# Patient Record
Sex: Female | Born: 1965 | Race: White | Hispanic: No | Marital: Single | Smoking: Never smoker
Health system: Southern US, Community
[De-identification: ages and names within clinical notes are randomized; demographics above are authoritative.]

## PROBLEM LIST (undated history)

## (undated) DIAGNOSIS — I10 Essential (primary) hypertension: Secondary | ICD-10-CM

## (undated) HISTORY — DX: Essential (primary) hypertension: I10

---

## 2016-01-19 LAB — TSH: TSH: 5.23 (ref 0.41–5.90)

## 2016-09-20 ENCOUNTER — Ambulatory Visit: Payer: Self-pay | Admitting: Physician Assistant

## 2016-09-26 ENCOUNTER — Ambulatory Visit: Payer: Self-pay | Admitting: Physician Assistant

## 2016-09-29 ENCOUNTER — Ambulatory Visit: Payer: Self-pay | Admitting: Physician Assistant

## 2016-10-10 ENCOUNTER — Ambulatory Visit: Payer: Self-pay

## 2016-10-10 ENCOUNTER — Ambulatory Visit: Payer: Self-pay | Admitting: Physician Assistant

## 2016-10-25 ENCOUNTER — Ambulatory Visit (INDEPENDENT_AMBULATORY_CARE_PROVIDER_SITE_OTHER): Payer: BLUE CROSS/BLUE SHIELD | Admitting: Physician Assistant

## 2016-10-25 VITALS — BP 140/92 | HR 76 | Ht 66.0 in | Wt 365.0 lb

## 2016-10-25 DIAGNOSIS — E039 Hypothyroidism, unspecified: Secondary | ICD-10-CM | POA: Diagnosis not present

## 2016-10-25 DIAGNOSIS — I10 Essential (primary) hypertension: Secondary | ICD-10-CM

## 2016-10-25 DIAGNOSIS — F439 Reaction to severe stress, unspecified: Secondary | ICD-10-CM

## 2016-10-25 DIAGNOSIS — R635 Abnormal weight gain: Secondary | ICD-10-CM | POA: Diagnosis not present

## 2016-10-25 DIAGNOSIS — K640 First degree hemorrhoids: Secondary | ICD-10-CM | POA: Diagnosis not present

## 2016-10-25 MED ORDER — PHENTERMINE HCL 37.5 MG PO TABS: 37.5000 mg | ORAL_TABLET | Freq: Every day | ORAL | 0 refills | Status: DC

## 2016-10-25 NOTE — Patient Instructions (Addendum)
belviq saxenda  About Hemorrhoids  Hemorrhoids are swollen veins in the lower rectum and anus.  Also called piles, hemorrhoids are a common problem.  Hemorrhoids may be internal (inside the rectum) or external (around the anus).  Internal Hemorrhoids  Internal hemorrhoids are often painless, but they rarely cause bleeding.  The internal veins may stretch and fall down (prolapse) through the anus to the outside of the body.  The veins may then become irritated and painful.  External Hemorrhoids  External hemorrhoids can be easily seen or felt around the anal opening.  They are under the skin around the anus.  When the swollen veins are scratched or broken by straining, rubbing or wiping they sometimes bleed.  How Hemorrhoids Occur  Veins in the rectum and around the anus tend to swell under pressure.  Hemorrhoids can result from increased pressure in the veins of your anus or rectum.  Some sources of pressure are:   Straining to have a bowel movement because of constipation  Waiting too long to have a bowel movement  Coughing and sneezing often  Sitting for extended periods of time, including on the toilet  Diarrhea  Obesity  Trauma or injury to the anus  Some liver diseases  Stress  Family history of hemorrhoids  Pregnancy  Pregnant women should try to avoid becoming constipated, because they are more likely to have hemorrhoids during pregnancy.  In the last trimester of pregnancy, the enlarged uterus may press on blood vessels and causes hemorrhoids.  In addition, the strain of childbirth sometimes causes hemorrhoids after the birth.  Symptoms of Hemorrhoids  Some symptoms of hemorrhoids include:  Swelling and/or a tender lump around the anus  Itching, mild burning and bleeding around the anus  Painful bowel movements with or without constipation  Bright red blood covering the stool, on toilet paper or in the toilet bowel.   Symptoms usually go away within a  few days.  Always talk to your doctor about any bleeding to make sure it is not from some other causes.  Diagnosing and Treating Hemorrhoids  Diagnosis is made by an examination by your healthcare provider.  Special test can be performed by your doctor.    Most cases of hemorrhoids can be treated with:  High-fiber diet: Eat more high-fiber foods, which help prevent constipation.  Ask for more detailed fiber information on types and sources of fiber from your healthcare provider.  Fluids: Drink plenty of water.  This helps soften bowel movements so they are easier to pass.  Sitz baths and cold packs: Sitting in lukewarm water two or three times a day for 15 minutes cleases the anal area and may relieve discomfort.  If the water is too hot, swelling around the anus will get worse.  Placing a cloth-covered ice pack on the anus for ten minutes four times a day can also help reduce selling.  Gently pushing a prolapsed hemorrhoid back inside after the bath or ice pack can be helpful.  Medications: For mild discomfort, your healthcare provider may suggest over-the-counter pain medication or prescribe a cream or ointment for topical use.  The cream may contain witch hazel, zinc oxide or petroleum jelly.  Medicated suppositories are also a treatment option.  Always consult your doctor before applying medications or creams.  Procedures and surgeries: There are also a number of procedures and surgeries to shrink or remove hemorrhoids in more serious cases.  Talk to your physician about these options.  You can often prevent  hemorrhoids or keep them from becoming worse by maintaining a healthy lifestyle.  Eat a fiber-rich diet of fruits, vegetables and whole grains.  Also, drink plenty of water and exercise regularly.   2007, [employer] Doc.30

## 2016-10-25 NOTE — Progress Notes (Signed)
Subjective:    Patient ID: Ann Stuart, female    DOB: 02/23/1965, 51 y.o.   MRN: [id number]  HPI  Pt is a 51 yo morbidly obese female who presents to the clinic to establish care.   .. Active Ambulatory Problems    Diagnosis Date Noted  . Hypothyroidism 10/25/2016  . Situational stress 10/25/2016  . Morbid obesity (HCC) 10/25/2016  . Abnormal weight gain 10/25/2016  . Essential hypertension 10/25/2016  . Grade I hemorrhoids 10/30/2016   Resolved Ambulatory Problems    Diagnosis Date Noted  . No Resolved Ambulatory Problems   No Additional Past Medical History   .Marland Kitchen Social History   Social History  . Marital status: Single    Spouse name: N/A  . Number of children: N/A  . Years of education: N/A   Occupational History  . Not on file.   Social History Main Topics  . Smoking status: Never Smoker  . Smokeless tobacco: Never Used  . Alcohol use Yes  . Drug use: No  . Sexual activity: Yes   Other Topics Concern  . Not on file   Social History Narrative  . No narrative on file   Pt moved here from [place] and gained 80lbs since move. She moved here for her boyfriend. 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 She eats a lot of fast food. Pt would like to get healthy again and start with weight loss.   She is in a stressful relationship with a man who is also married. This puts extra stress on her.   She is also having some painful bowel movements with some pressure. She has not tried anything to make better. Occasionally had some bright red blood.    Review of Systems  All other systems reviewed and are negative.      Objective:   Physical Exam  Constitutional: She is oriented to person, place, and time. She appears well-developed and well-nourished.  HENT:  Head: Normocephalic and atraumatic.  Neck: Normal range of motion. Neck supple. No thyromegaly present.  Cardiovascular: Normal rate, regular rhythm and normal heart sounds.   Pulmonary/Chest:  Effort normal and breath sounds normal. She has no wheezes.  Genitourinary:     Neurological: She is alert and oriented to person, place, and time.  Skin: Skin is dry.  Psychiatric: She has a normal mood and affect. Her behavior is normal.          Assessment & Plan:  Marland KitchenMarland KitchenFlorence was seen today for establish care.  Diagnoses and all orders for this visit:  Essential hypertension  Abnormal weight gain -     phentermine (ADIPEX-P) 37.5 MG tablet; Take 1 tablet (37.5 mg total) by mouth daily before breakfast.  Morbid obesity (HCC) -     phentermine (ADIPEX-P) 37.5 MG tablet; Take 1 tablet (37.5 mg total) by mouth daily before breakfast.  Situational stress  Hypothyroidism, unspecified type  Grade I hemorrhoids -     hydrocortisone-pramoxine (PROCTOFOAM-HC) rectal foam; Place 1 applicator rectally 2 (two) times daily.   .. Depression screen PHQ 2/9 10/30/2016  Decreased Interest 1  Down, Depressed, Hopeless 2  PHQ - 2 Score 3  Altered sleeping 1  Tired, decreased energy 3  Change in appetite 3  Feeling bad or failure about yourself  2  Trouble concentrating 0  Moving slowly or fidgety/restless 0  Suicidal thoughts 0  PHQ-9 Score 12  Difficult doing work/chores Somewhat difficult   Pt does not  want to start any medications at this time.   Start cream for hemmorrhoids. HO given for prevention. Weight loss could help. Also daily miralax.   Discussed options for weight loss. Pt would like to start phentermine. Discussed side effects. Marland Kitchen.Discussed low carb diet with 1500 calories and 80g of protein.  Exercising at least 150 minutes a week.  [employer] could be a Chief Technology Officer.  Encouraged long term medications.  Will have to watch BP closely.  Follow up in 1 month.  Pt aware could make constipation worse.

## 2016-10-30 ENCOUNTER — Encounter: Payer: Self-pay | Admitting: Physician Assistant

## 2016-10-30 DIAGNOSIS — K640 First degree hemorrhoids: Secondary | ICD-10-CM | POA: Insufficient documentation

## 2016-10-30 MED ORDER — HYDROCORTISONE ACE-PRAMOXINE 1-1 % RE FOAM: 1.0000 | Freq: Two times a day (BID) | RECTAL | 5 refills | Status: DC

## 2016-11-27 ENCOUNTER — Ambulatory Visit: Payer: BLUE CROSS/BLUE SHIELD | Admitting: Physician Assistant

## 2016-11-27 DIAGNOSIS — Z0189 Encounter for other specified special examinations: Secondary | ICD-10-CM

## 2017-02-20 ENCOUNTER — Telehealth: Payer: Self-pay | Admitting: Physician Assistant

## 2017-02-20 MED ORDER — METOPROLOL SUCCINATE ER 200 MG PO TB24: 200.0000 mg | ORAL_TABLET | Freq: Every day | ORAL | 0 refills | Status: DC

## 2017-02-20 MED ORDER — LOSARTAN POTASSIUM 100 MG PO TABS: 100.0000 mg | ORAL_TABLET | Freq: Every day | ORAL | 0 refills | Status: DC

## 2017-02-20 MED ORDER — OMEPRAZOLE 20 MG PO CPDR: 20.0000 mg | DELAYED_RELEASE_CAPSULE | Freq: Every day | ORAL | 3 refills | Status: DC

## 2017-02-20 MED ORDER — AMLODIPINE BESYLATE 10 MG PO TABS: 10.0000 mg | ORAL_TABLET | Freq: Every day | ORAL | 0 refills | Status: DC

## 2017-02-20 MED ORDER — LEVOTHYROXINE SODIUM 137 MCG PO TABS: 137.0000 ug | ORAL_TABLET | Freq: Every day | ORAL | 0 refills | Status: DC

## 2017-02-20 NOTE — Telephone Encounter (Signed)
I sent refills for one month until visit.

## 2017-02-20 NOTE — Telephone Encounter (Signed)
All Rx's requested are listed by historical provider, will route for review.

## 2017-02-20 NOTE — Telephone Encounter (Signed)
Pt called and stated she needs a refill on her Prilosec, toprol, levothyroxine, amlodipine, losartan. She needs those sent to the [facility] in [place] off [address]. She has an appointment with Lesly RubensteinJade on Jan. 22,2019. Thanks

## 2017-02-21 NOTE — Telephone Encounter (Signed)
Pt advised. No further questions.  

## 2017-02-27 ENCOUNTER — Ambulatory Visit: Payer: BLUE CROSS/BLUE SHIELD | Admitting: Physician Assistant

## 2017-03-06 ENCOUNTER — Ambulatory Visit: Payer: Self-pay | Admitting: Physician Assistant

## 2017-03-13 ENCOUNTER — Ambulatory Visit: Payer: Self-pay | Admitting: Physician Assistant

## 2017-03-16 ENCOUNTER — Ambulatory Visit: Payer: Self-pay

## 2017-03-20 ENCOUNTER — Encounter: Payer: Self-pay | Admitting: Physician Assistant

## 2017-03-20 ENCOUNTER — Ambulatory Visit (INDEPENDENT_AMBULATORY_CARE_PROVIDER_SITE_OTHER): Payer: BLUE CROSS/BLUE SHIELD | Admitting: Physician Assistant

## 2017-03-20 ENCOUNTER — Ambulatory Visit (INDEPENDENT_AMBULATORY_CARE_PROVIDER_SITE_OTHER): Payer: BLUE CROSS/BLUE SHIELD

## 2017-03-20 ENCOUNTER — Encounter (INDEPENDENT_AMBULATORY_CARE_PROVIDER_SITE_OTHER): Payer: Self-pay

## 2017-03-20 DIAGNOSIS — Z1231 Encounter for screening mammogram for malignant neoplasm of breast: Secondary | ICD-10-CM

## 2017-03-20 DIAGNOSIS — Z8701 Personal history of pneumonia (recurrent): Secondary | ICD-10-CM | POA: Diagnosis not present

## 2017-03-20 DIAGNOSIS — H8109 Meniere's disease, unspecified ear: Secondary | ICD-10-CM

## 2017-03-20 DIAGNOSIS — I10 Essential (primary) hypertension: Secondary | ICD-10-CM

## 2017-03-20 DIAGNOSIS — Z131 Encounter for screening for diabetes mellitus: Secondary | ICD-10-CM

## 2017-03-20 DIAGNOSIS — R0602 Shortness of breath: Secondary | ICD-10-CM | POA: Diagnosis not present

## 2017-03-20 DIAGNOSIS — IMO0002 Reserved for concepts with insufficient information to code with codable children: Secondary | ICD-10-CM | POA: Insufficient documentation

## 2017-03-20 DIAGNOSIS — Z13220 Encounter for screening for lipoid disorders: Secondary | ICD-10-CM | POA: Diagnosis not present

## 2017-03-20 DIAGNOSIS — E039 Hypothyroidism, unspecified: Secondary | ICD-10-CM | POA: Diagnosis not present

## 2017-03-20 DIAGNOSIS — R0789 Other chest pain: Secondary | ICD-10-CM

## 2017-03-20 DIAGNOSIS — L719 Rosacea, unspecified: Secondary | ICD-10-CM

## 2017-03-20 DIAGNOSIS — Z1211 Encounter for screening for malignant neoplasm of colon: Secondary | ICD-10-CM

## 2017-03-20 DIAGNOSIS — Q349 Congenital malformation of respiratory system, unspecified: Secondary | ICD-10-CM

## 2017-03-20 DIAGNOSIS — N926 Irregular menstruation, unspecified: Secondary | ICD-10-CM

## 2017-03-20 MED ORDER — DOXYCYCLINE 40 MG PO CPDR: 40.0000 mg | DELAYED_RELEASE_CAPSULE | ORAL | 2 refills | Status: DC

## 2017-03-20 NOTE — Patient Instructions (Addendum)
[employer]- life coach [employer] 2 week recheck BP in office or send via mychart.   Exercise HR is 220-age.   Liraglutide injection (Weight Management) What is this medicine? LIRAGLUTIDE (LIR a GLOO tide) is used with a reduced calorie diet and exercise to help you lose weight. This medicine may be used for other purposes; ask your health care provider or pharmacist if you have questions. COMMON BRAND NAME(S): Saxenda What should I tell my health care provider before I take this medicine? They need to know if you have any of these conditions: -endocrine tumors (MEN 2) or if someone in your family had these tumors -gallbladder disease -high cholesterol -history of alcohol abuse problem -history of pancreatitis -kidney disease or if you are on dialysis -liver disease -previous swelling of the tongue, face, or lips with difficulty breathing, difficulty swallowing, hoarseness, or tightening of the throat -stomach problems -suicidal thoughts, plans, or attempt; a previous suicide attempt by you or a family member -thyroid cancer or if someone in your family had thyroid cancer -an unusual or allergic reaction to liraglutide, other medicines, foods, dyes, or preservatives -pregnant or trying to get pregnant -breast-feeding How should I use this medicine? This medicine is for injection under the skin of your upper leg, stomach area, or upper arm. You will be taught how to prepare and give this medicine. Use exactly as directed. Take your medicine at regular intervals. Do not take it more often than directed. It is important that you put your used needles and syringes in a special sharps container. Do not put them in a trash can. If you do not have a sharps container, call your pharmacist or healthcare provider to get one. A special MedGuide will be given to you by the pharmacist with each prescription and refill. Be sure to read this information carefully each time. Talk to your  pediatrician regarding the use of this medicine in children. Special care may be needed. Overdosage: If you think you have taken too much of this medicine contact a poison control center or emergency room at once. NOTE: This medicine is only for you. Do not share this medicine with others. What if I miss a dose? If you miss a dose, take it as soon as you can. If it is almost time for your next dose, take only that dose. Do not take double or extra doses. If you miss your dose for 3 days or more, call your doctor or health care professional to talk about how to restart this medicine. What may interact with this medicine? -insulin and other medicines for diabetes This list may not describe all possible interactions. Give your health care provider a list of all the medicines, herbs, non-prescription drugs, or dietary supplements you use. Also tell them if you smoke, drink alcohol, or use illegal drugs. Some items may interact with your medicine. What should I watch for while using this medicine? Visit your doctor or health care professional for regular checks on your progress. This medicine is intended to be used in addition to a healthy diet and appropriate exercise. The best results are achieved this way. Do not increase or in any way change your dose without consulting your doctor or health care professional. Drink plenty of fluids while taking this medicine. Check with your doctor or health care professional if you get an attack of severe diarrhea, nausea, and vomiting. The loss of too much body fluid can make it dangerous for you to take this medicine.  This medicine may affect blood sugar levels. If you have diabetes, check with your doctor or health care professional before you change your diet or the dose of your diabetic medicine. Patients and their families should watch out for worsening depression or thoughts of suicide. Also watch out for sudden changes in feelings such as feeling anxious,  agitated, panicky, irritable, hostile, aggressive, impulsive, severely restless, overly excited and hyperactive, or not being able to sleep. If this happens, especially at the beginning of treatment or after a change in dose, call your health care professional. What side effects may I notice from receiving this medicine? Side effects that you should report to your doctor or health care professional as soon as possible: -allergic reactions like skin rash, itching or hives, swelling of the face, lips, or tongue -breathing problems -diarrhea that continues or is severe -lump or swelling on the neck -severe nausea -signs and symptoms of infection like fever or chills; cough; sore throat; pain or trouble passing urine -signs and symptoms of low blood sugar such as feeling anxious, confusion, dizziness, increased hunger, unusually weak or tired, sweating, shakiness, cold, irritable, headache, blurred vision, fast heartbeat, loss of consciousness -signs and symptoms of kidney injury like trouble passing urine or change in the amount of urine -trouble swallowing -unusual stomach upset or pain -vomiting Side effects that usually do not require medical attention (report to your doctor or health care professional if they continue or are bothersome): -constipation -decreased appetite -diarrhea -fatigue -headache -nausea -pain, redness, or irritation at site where injected -stomach upset -stuffy or runny nose This list may not describe all possible side effects. Call your doctor for medical advice about side effects. You may report side effects to FDA at 1-800-FDA-1088. Where should I keep my medicine? Keep out of the reach of children. Store unopened pen in a refrigerator between 2 and 8 degrees C (36 and 46 degrees F). Do not freeze or use if the medicine has been frozen. Protect from light and excessive heat. After you first use the pen, it can be stored at room temperature between 15 and 30 degrees  C (59 and 86 degrees F) or in a refrigerator. Throw away your used pen after 30 days or after the expiration date, whichever comes first. Do not store your pen with the needle attached. If the needle is left on, medicine may leak from the pen. NOTE: This sheet is a summary. It may not cover all possible information. If you have questions about this medicine, talk to your doctor, pharmacist, or health care provider.  2018 Elsevier/Gold Standard (2016-02-10 14:41:37) Lorcaserin oral tablets What is this medicine? LORCASERIN (lor ca SER in) is used to promote and maintain weight loss in obese patients. This medicine should be used with a reduced calorie diet and, if appropriate, an exercise program. This medicine may be used for other purposes; ask your health care provider or pharmacist if you have questions. COMMON BRAND NAME(S): Belviq What should I tell my health care provider before I take this medicine? They need to know if you have any of these conditions: -anatomical deformation of the penis, Peyronie's disease, or history of priapism (painful and prolonged erection) -diabetes -heart disease -history of blood diseases, like sickle cell anemia or leukemia -history of irregular heartbeat -kidney disease -liver disease -suicidal thoughts, plans, or attempt; a previous suicide attempt by you or a family member -an unusual or allergic reaction to lorcaserin, other medicines, foods, dyes, or preservatives -pregnant or trying to  get pregnant -breast-feeding How should I use this medicine? Take this medicine by mouth with a glass of water. Follow the directions on the prescription label. You can take it with or without food. Take your medicine at regular intervals. Do not take it more often than directed. Do not stop taking except on your doctor's advice. Talk to your pediatrician regarding the use of this medicine in children. Special care may be needed. Overdosage: If you think you have  taken too much of this medicine contact a poison control center or emergency room at once. NOTE: This medicine is only for you. Do not share this medicine with others. What if I miss a dose? If you miss a dose, take it as soon as you can. If it is almost time for your next dose, take only that dose. Do not take double or extra doses. What may interact with this medicine? -cabergoline -certain medicines for depression, anxiety, or psychotic disturbances -certain medicines for erectile dysfunction -certain medicines for migraine headache like almotriptan, eletriptan, frovatriptan, naratriptan, rizatriptan, sumatriptan, zolmitriptan -dextromethorphan -linezolid -lithium -medicines for diabetes -other weight loss products -tramadol -St. John's Wort -stimulant medicines for attention disorders, weight loss, or to stay awake -tryptophan This list may not describe all possible interactions. Give your health care provider a list of all the medicines, herbs, non-prescription drugs, or dietary supplements you use. Also tell them if you smoke, drink alcohol, or use illegal drugs. Some items may interact with your medicine. What should I watch for while using this medicine? This medicine is intended to be used in addition to a healthy diet and appropriate exercise. The best results are achieved this way. Your doctor should instruct you to stop taking this medicine if you do not lose a certain amount of weight within the first 12 weeks of treatment, but it is important that you do not change your dose in any way without consulting your doctor or health care professional. Visit your doctor or health care professional for regular checkups. Your doctor may order blood tests or other tests to see how you are doing. Do not drive, use machinery, or do anything that needs mental alertness until you know how this medicine affects you. This medicine may affect blood sugar levels. If you have diabetes, check with  your doctor or health care professional before you change your diet or the dose of your diabetic medicine. Patients and their families should watch out for worsening depression or thoughts of suicide. Also watch out for sudden changes in feelings such as feeling anxious, agitated, panicky, irritable, hostile, aggressive, impulsive, severely restless, overly excited and hyperactive, or not being able to sleep. If this happens, especially at the beginning of treatment or after a change in dose, call your health care professional. Contact your doctor or health care professional right away if you are a man with an erection that lasts longer than 4 hours or if the erection becomes painful. This may be a sign of serious problem and must be treated right away to prevent permanent damage. What side effects may I notice from receiving this medicine? Side effects that you should report to your doctor or health care professional as soon as possible: -allergic reactions like skin rash, itching or hives, swelling of the face, lips, or tongue -abnormal production of milk -breast enlargement in both males and females -breathing problems -changes in emotions or moods -changes in vision -confusion -erection lasting more than 4 hours or a painful erection -fast or irregular  heart beat -feeling faint or lightheaded, falls -fever or chills, sore throat -hallucination, loss of contact with reality -high or low blood pressure -menstrual changes -restlessness -slow or irregular heartbeat -stiff muscles -sweating -suicidal thoughts or other mood changes -swelling of the ankles, feet, hands -unusually weak or tired -vomiting Side effects that usually do not require medical attention (report to your doctor or health care professional if they continue or are bothersome): -back pain -constipation -cough -dry mouth -nausea -tiredness This list may not describe all possible side effects. Call your doctor for  medical advice about side effects. You may report side effects to FDA at 1-800-FDA-1088. Where should I keep my medicine? Keep out of the reach of children. This medicine can be abused. Keep your medicine in a safe place to protect it from theft. Do not share this medicine with anyone. Selling or giving away this medicine is dangerous and against the law. Store at room temperature between 15 and 30 degrees C (59 and 86 degrees F). Throw away any unused medicine after the expiration date. NOTE: This sheet is a summary. It may not cover all possible information. If you have questions about this medicine, talk to your doctor, pharmacist, or health care provider.  2018 Elsevier/Gold Standard (2015-02-25 12:13:31)

## 2017-03-20 NOTE — Progress Notes (Signed)
Subjective:    Patient ID: Ann Stuart, female    DOB: [birth year], 52 y.o.   MRN: [id number]  HPI  Patient is a 52 year old morbidly obese female with a past medical history of hypertension, rosacea, hypothyroidism, who presents to the clinic today for follow-up.  Patient was seen once before for an introduction.  We have not really made any medication changes with patient.  She has just moved to [place] where she can be more accountable and make appointments.  At last visit we had talked about weight loss which is her biggest concern today.  She was given phentermine which she did not feel comfortable taking.  She is very concerned with her weight.  She feels like she just has to be motivated and know what to eat.  She feels like everything  contradicts himself.  She admits right now she is not exercising or dieting.  She is very interested in medication to help with this.  She also wants to make sure that her body is capable and ready to exercise.  She admits she is having some left-sided chest/breast pain palpable.  She feels like it could be worsening somewhat with exertion.  She is having shortness of breath with any exertion.  She wants to make sure this is okay.  She requests something for her rosacea.  She has tried MetroGel in the past with no benefit.  She has been on minocycline and worked well.  Patient is ready to get her health screenings done.  She needs referrals for mammograms and colonoscopies.  GYN in [place]. Need to get records.   .. Active Ambulatory Problems    Diagnosis Date Noted  . Hypothyroidism 10/25/2016  . Situational stress 10/25/2016  . Morbid obesity (HCC) 10/25/2016  . Abnormal weight gain 10/25/2016  . Essential hypertension 10/25/2016  . Grade I hemorrhoids 10/30/2016  . Meniere's disease 03/20/2017  . Thoracic cyst 03/20/2017  . Menstrual abnormality 03/20/2017  . Rosacea 03/20/2017  . SOB (shortness of breath) 03/20/2017  . Atypical chest  pain 03/20/2017   Resolved Ambulatory Problems    Diagnosis Date Noted  . No Resolved Ambulatory Problems   No Additional Past Medical History      Review of Systems    see HPI.  Objective:   Physical Exam  Constitutional: She is oriented to person, place, and time. She appears well-developed and well-nourished.  morbidy obesity.   HENT:  Head: Normocephalic and atraumatic.  Right Ear: External ear normal.  Left Ear: External ear normal.  Mouth/Throat: Oropharynx is clear and moist. No oropharyngeal exudate.  Cheeks erythematous.   Eyes: Conjunctivae are normal. Right eye exhibits no discharge. Left eye exhibits no discharge.  Neck: Normal range of motion. Neck supple. No thyromegaly present.  Neck fullness.   Cardiovascular: Normal rate, regular rhythm and normal heart sounds.  Pulmonary/Chest: Effort normal and breath sounds normal. She has no wheezes. She exhibits no tenderness.  Abdominal: Soft. Bowel sounds are normal.  Lymphadenopathy:    She has no cervical adenopathy.  Neurological: She is alert and oriented to person, place, and time.  Psychiatric: She has a normal mood and affect. Her behavior is normal.          Assessment & Plan:  Marland KitchenMarland KitchenDiagnoses and all orders for this visit:  Morbid obesity (HCC) -     CBC with Differential/Platelet  Hypothyroidism, unspecified type -     TSH  Meniere's disease, unspecified laterality -     CBC with Differential/Platelet  Screening for lipid disorders -     Lipid Panel w/reflex Direct LDL  Screening for diabetes mellitus -     COMPLETE METABOLIC PANEL WITH GFR -     CBC with Differential/Platelet  Thoracic cyst  Menstrual abnormality -     FSH/LH  Atypical chest pain -     EKG 12-Lead  SOB (shortness of breath) -     DG Chest 2 View  Rosacea  Essential hypertension  Visit for screening mammogram -     MM SCREENING BREAST TOMO BILATERAL; Future -     MM SCREENING BREAST TOMO BILATERAL  Colon  cancer screening -     Ambulatory referral to Gastroenterology  Other orders -     Discontinue: doxycycline (ORACEA) 40 MG capsule; Take 1 capsule (40 mg total) by mouth every morning.    .. [facility]2017 10/30/2016  Decreased Interest 0 1  Down, Depressed, Hopeless 0 2  PHQ - 2 Score 0 3  Altered sleeping 0 1  Tired, decreased energy 0 3  Change in appetite 0 3  Feeling bad or failure about yourself  0 2  Trouble concentrating 0 0  Moving slowly or fidgety/restless 0 0  Suicidal thoughts 0 0  PHQ-9 Score 0 12  Difficult doing work/chores - Somewhat difficult   Mammogram and colonoscopy ordered.  Fasting labs ordered.   Most of discussion today with surveilling patient's morbid obesity.  She wants to take control of this and start losing weight this year however she is really concerned with doing it the right way to make sure it does not affect her health.  She is having some atypical left-sided chest pain/breast pain.  She does not notice that better or worse with exertion.  The shortness of breath is always worse with exercise.  EKG done today normal sinus rhythm with no ST depression or elevation.  No arrhythmia.  Chest x-ray done today was unremarkable.  Discussed with patient I do think she can start exercising.  I gave her a target max heart rate of 220- her age.  I do think she should start with more than 5 pack exercises and possibly in a pool if she could.  I did discuss counting her calories to at least 1800 a day.  Encouraged her to decrease carbs and increase protein.  Encouraged at least 150 minutes of exercise a week.  I do think it would be also for her to have a life coach.  I do think to make these changes have been I gave her handouts on these 2 medications.  I gave her the name and number of a life coach and currently well, [place] MasterJessica Jones.  We discussed medication options.  She does not feel comfortable with phentermine.  Unfortunately that is the  cheapest option we have.  I did discuss 2 other long-term options with patient's Belviq and Saxenda.  I gave her handouts on these medications.  She can call back at any time and I will prescribe whichever when she would rather try.  She has no history of thyroid cancer or pancreatitis.  I spent a little time today reassuring patient that baby steps are okay towards making huge life changes.  I would like to meet with her in the next month to see how she is apical her weight loss journey.  My goal today was to make sure she is generally stable for exercise.  BP not at goal today. She is on norvasc/metoprolol/losaartan.  She reports good readings at home. Recheck in 2 weeks. Keep BP log. If not improving need to make some medication changes.   Doxycycline low dose given for rosacea.  Patient has tried MetroGel in the past and did not have a great experience with this.  Marland Kitchen.Spent 45 minutes with patient and greater than 50 percent of visit spent counseling patient regarding treatment plan.

## 2017-03-21 ENCOUNTER — Telehealth: Payer: Self-pay | Admitting: Physician Assistant

## 2017-03-21 NOTE — Telephone Encounter (Signed)
Forms sent to covermymeds. Awaiting determination.

## 2017-03-22 ENCOUNTER — Other Ambulatory Visit: Payer: Self-pay | Admitting: Physician Assistant

## 2017-03-23 MED ORDER — DOXYCYCLINE HYCLATE 20 MG PO TABS: 20.0000 mg | ORAL_TABLET | Freq: Two times a day (BID) | ORAL | 2 refills | Status: DC

## 2017-03-23 NOTE — Telephone Encounter (Signed)
Received letter form [employer] that Doxycycline 40 mg  capsules has been denied and patient will need to try and fail an alternative. Papers are in your box for review.

## 2017-03-23 NOTE — Telephone Encounter (Signed)
Per [employer] letter - capsule was not covered but tablet was. Called [facility], they ran tablet through for a copay of $38.41. Cancelled capsule and sent Rx for same strength in the available tablet form.

## 2017-03-25 NOTE — Telephone Encounter (Signed)
Thanks

## 2017-03-26 ENCOUNTER — Encounter: Payer: Self-pay | Admitting: Physician Assistant

## 2017-03-26 LAB — T4, FREE
T4,Free (Direct): 0.98
TSH: 7.42

## 2017-03-28 ENCOUNTER — Telehealth: Payer: Self-pay | Admitting: Physician Assistant

## 2017-03-28 NOTE — Telephone Encounter (Signed)
Pt called because she had a call from GI trying to schedule her for a colonoscopy but pt stated she got one back in 2017 and will send the records over for us to have. She did state that she is due for a mammogram and wants to know if one of those can be ordered. Thanks

## 2017-03-29 NOTE — Telephone Encounter (Signed)
Left VM for Pt to see where she had her colonoscopy so we can send a request for it. Also need to know if she has ever had an abnormal mammogram before so I know which screening mammogram to order. Callback information provided.

## 2017-04-11 ENCOUNTER — Ambulatory Visit: Payer: BLUE CROSS/BLUE SHIELD

## 2017-04-27 ENCOUNTER — Ambulatory Visit: Payer: BLUE CROSS/BLUE SHIELD

## 2017-05-05 ENCOUNTER — Other Ambulatory Visit: Payer: Self-pay | Admitting: Physician Assistant

## 2017-05-28 ENCOUNTER — Encounter: Payer: Self-pay | Admitting: Physician Assistant

## 2017-06-02 ENCOUNTER — Other Ambulatory Visit: Payer: Self-pay | Admitting: Physician Assistant

## 2017-07-13 ENCOUNTER — Other Ambulatory Visit: Payer: Self-pay | Admitting: Physician Assistant

## 2017-08-02 ENCOUNTER — Other Ambulatory Visit: Payer: Self-pay | Admitting: Physician Assistant

## 2017-08-17 ENCOUNTER — Other Ambulatory Visit: Payer: Self-pay | Admitting: Physician Assistant

## 2017-08-18 ENCOUNTER — Other Ambulatory Visit: Payer: Self-pay | Admitting: Physician Assistant

## 2017-08-24 ENCOUNTER — Ambulatory Visit (INDEPENDENT_AMBULATORY_CARE_PROVIDER_SITE_OTHER): Payer: BLUE CROSS/BLUE SHIELD | Admitting: Physician Assistant

## 2017-08-24 DIAGNOSIS — Z8742 Personal history of other diseases of the female genital tract: Secondary | ICD-10-CM

## 2017-08-24 DIAGNOSIS — Z1239 Encounter for other screening for malignant neoplasm of breast: Secondary | ICD-10-CM

## 2017-08-24 DIAGNOSIS — Z1211 Encounter for screening for malignant neoplasm of colon: Secondary | ICD-10-CM

## 2017-08-24 DIAGNOSIS — J302 Other seasonal allergic rhinitis: Secondary | ICD-10-CM | POA: Diagnosis not present

## 2017-08-24 DIAGNOSIS — G8929 Other chronic pain: Secondary | ICD-10-CM

## 2017-08-24 DIAGNOSIS — Q349 Congenital malformation of respiratory system, unspecified: Secondary | ICD-10-CM

## 2017-08-24 DIAGNOSIS — IMO0002 Reserved for concepts with insufficient information to code with codable children: Secondary | ICD-10-CM

## 2017-08-24 DIAGNOSIS — Z7251 High risk heterosexual behavior: Secondary | ICD-10-CM

## 2017-08-24 DIAGNOSIS — Z1231 Encounter for screening mammogram for malignant neoplasm of breast: Secondary | ICD-10-CM | POA: Diagnosis not present

## 2017-08-24 DIAGNOSIS — Z114 Encounter for screening for human immunodeficiency virus [HIV]: Secondary | ICD-10-CM | POA: Diagnosis not present

## 2017-08-24 DIAGNOSIS — R1032 Left lower quadrant pain: Secondary | ICD-10-CM | POA: Diagnosis not present

## 2017-08-24 DIAGNOSIS — M899 Disorder of bone, unspecified: Secondary | ICD-10-CM | POA: Diagnosis not present

## 2017-08-24 DIAGNOSIS — R1031 Right lower quadrant pain: Secondary | ICD-10-CM

## 2017-08-24 NOTE — Patient Instructions (Addendum)
contrave belviq saxenda qsymia  Start zyrtec/claritin/allegra- nasocort nasal spray.   Will get MRI and pelvic u/s.

## 2017-08-24 NOTE — Progress Notes (Signed)
Subjective:    Patient ID: Ann Stuart, female    DOB: February 11, 1965, 52 y.o.   MRN: [id number]  HPI  Pt is a morbidly obese [place] female who presents to the clinic to "start working on her health". She is ready to do screenings and work on weight.   She has not follewed up on thoracic cyst on past MRI in years. She request MRI.   She discuss hx of ovarian cyst and she is having intermittent lower quadrant pain and cramping and would like this looked at. She was also told her endometrium lining was thick.  She is concerned about cancer.   She never started phentermine for weight loss. She is not exercising or dieting.   She did not take BP medications today. She denies any CP, palpitations, headaches or vision changes.   She does have ongoing sinus pressure and nasal congestion. No runny nose. No fever, chills. She uses flonase with some relief. No other medications tried.   .. Active Ambulatory Problems    Diagnosis Date Noted  . Hypothyroidism 10/25/2016  . Situational stress 10/25/2016  . Morbidly obese (HCC) 10/25/2016  . Abnormal weight gain 10/25/2016  . Essential hypertension 10/25/2016  . Grade I hemorrhoids 10/30/2016  . Meniere's disease 03/20/2017  . Thoracic cyst 03/20/2017  . Menstrual abnormality 03/20/2017  . Rosacea 03/20/2017  . SOB (shortness of breath) 03/20/2017  . Atypical chest pain 03/20/2017  . Disorder of bone 08/27/2017  . History of ovarian cyst 08/27/2017  . Abdominal pain, chronic, bilateral lower quadrant 08/27/2017   Resolved Ambulatory Problems    Diagnosis Date Noted  . No Resolved Ambulatory Problems   No Additional Past Medical History     .   Review of Systems    see HPI.  Objective:   Physical Exam  Constitutional: She is oriented to person, place, and time. She appears well-developed and well-nourished.  HENT:  Head: Normocephalic and atraumatic.  Cardiovascular: Normal rate and regular rhythm.  Pulmonary/Chest:  Effort normal and breath sounds normal.  Neurological: She is alert and oriented to person, place, and time.  Psychiatric: She has a normal mood and affect. Her behavior is normal.          Assessment & Plan:  Marland Kitchen.Marland Kitchen.Diagnoses and all orders for this visit:  Morbidly obese (HCC)  Screening for HIV (human immunodeficiency virus) -     HIV antibody (with reflex)  History of ovarian cyst -     US PELVIS (TRANSABDOMINAL ONLY) -     US PELVIS TRANSVANGINAL NON-OB (TV ONLY)  Abdominal pain, chronic, bilateral lower quadrant -     US PELVIS (TRANSABDOMINAL ONLY) -     US PELVIS TRANSVANGINAL NON-OB (TV ONLY)  Unprotected sex  Disorder of bone -     MR Thoracic Spine Wo Contrast; Future -     MR Thoracic Spine Wo Contrast  Breast cancer screening -     MM 3D SCREEN BREAST BILATERAL  Colon cancer screening -     Ambulatory referral to Gastroenterology  Thoracic cyst  Seasonal allergic rhinitis, unspecified trigger   Will get MRI. See past MRI for comparison.   Ordered mammogram and colonoscopy for screening purposes.   Due to hx and intermittent pain. Will get u/s of pelvis.   Pt declines other STI testing but wants HIV. Ordered HIV. She does have unprotected sex with a married man.   Phentermine can increase BP. TAKE medication and if BP below 140/90 may start phentermine.  Follow up in 1 month. Start with 1/2 tablet. Pt aware of side effects. Given list of weight loss medications to ask insurance about. I think she will need better long term options.   For sinus pressure/allergric rhinitis try zyrtec and nasocort. Follow up as needed.

## 2017-08-27 ENCOUNTER — Encounter: Payer: Self-pay | Admitting: Physician Assistant

## 2017-08-27 DIAGNOSIS — R1032 Left lower quadrant pain: Secondary | ICD-10-CM

## 2017-08-27 DIAGNOSIS — J302 Other seasonal allergic rhinitis: Secondary | ICD-10-CM | POA: Insufficient documentation

## 2017-08-27 DIAGNOSIS — G8929 Other chronic pain: Secondary | ICD-10-CM | POA: Insufficient documentation

## 2017-08-27 DIAGNOSIS — M899 Disorder of bone, unspecified: Secondary | ICD-10-CM | POA: Insufficient documentation

## 2017-08-27 DIAGNOSIS — Z8742 Personal history of other diseases of the female genital tract: Secondary | ICD-10-CM | POA: Insufficient documentation

## 2017-08-27 DIAGNOSIS — R1031 Right lower quadrant pain: Secondary | ICD-10-CM

## 2017-09-07 ENCOUNTER — Other Ambulatory Visit: Payer: Self-pay | Admitting: Physician Assistant

## 2017-09-07 ENCOUNTER — Telehealth: Payer: Self-pay | Admitting: Physician Assistant

## 2017-09-07 MED ORDER — LEVOTHYROXINE SODIUM 137 MCG PO TABS: 137.0000 ug | ORAL_TABLET | Freq: Every day | ORAL | 0 refills | Status: DC

## 2017-09-07 NOTE — Telephone Encounter (Signed)
Short term supply sent. Pt advised she must keep appt and get labs done.

## 2017-09-07 NOTE — Telephone Encounter (Signed)
Pt called. She has been out of her thyroid meds for 4 days and she's feeling a little off.   She says she has left messages 2 or  3 days ago  but not sure where she left those messages.  She contacted her pharmacy this morning.

## 2017-09-07 NOTE — Telephone Encounter (Signed)
Pt called. She is needs a refill on her thyroid med.. She has been off of it for 4 days and feels off.  She said she has left several messages and has not heard back from us. I told her to call us back about 3:00 to see if rx was called in.

## 2017-09-10 NOTE — Telephone Encounter (Signed)
Thank you, I left pt a vm

## 2017-09-18 ENCOUNTER — Ambulatory Visit: Payer: BLUE CROSS/BLUE SHIELD | Admitting: Physician Assistant

## 2017-09-23 ENCOUNTER — Other Ambulatory Visit: Payer: BLUE CROSS/BLUE SHIELD

## 2017-09-23 ENCOUNTER — Inpatient Hospital Stay: Admission: RE | Admit: 2017-09-23 | Payer: BLUE CROSS/BLUE SHIELD | Source: Ambulatory Visit

## 2017-09-27 ENCOUNTER — Encounter: Payer: Self-pay | Admitting: Physician Assistant

## 2017-09-28 ENCOUNTER — Other Ambulatory Visit: Payer: Self-pay | Admitting: *Deleted

## 2017-09-28 MED ORDER — LEVOTHYROXINE SODIUM 137 MCG PO TABS: 137.0000 ug | ORAL_TABLET | Freq: Every day | ORAL | 0 refills | Status: DC

## 2017-09-29 LAB — CBC WITH DIFFERENTIAL/PLATELET
BASOS ABS: 60 {cells}/uL (ref 0–200)
Basophils Relative: 0.4 %
EOS PCT: 1.2 %
Eosinophils Absolute: 181 cells/uL (ref 15–500)
HEMATOCRIT: 39.6 % (ref 35.0–45.0)
Hemoglobin: 13.2 g/dL (ref 11.7–15.5)
Lymphs Abs: 2929 cells/uL (ref 850–3900)
MCH: 27.3 pg (ref 27.0–33.0)
MCHC: 33.3 g/dL (ref 32.0–36.0)
MCV: 81.8 fL (ref 80.0–100.0)
MPV: 11.1 fL (ref 7.5–12.5)
Monocytes Relative: 6.5 %
Neutro Abs: 10948 cells/uL — ABNORMAL HIGH (ref 1500–7800)
Neutrophils Relative %: 72.5 %
PLATELETS: 440 10*3/uL — AB (ref 140–400)
RBC: 4.84 10*6/uL (ref 3.80–5.10)
RDW: 14.4 % (ref 11.0–15.0)
TOTAL LYMPHOCYTE: 19.4 %
WBC: 15.1 10*3/uL — AB (ref 3.8–10.8)
WBCMIX: 982 {cells}/uL — AB (ref 200–950)

## 2017-09-29 LAB — LIPID PANEL W/REFLEX DIRECT LDL
CHOL/HDL RATIO: 4.5 (calc) (ref <5.0)
Cholesterol: 218 mg/dL — ABNORMAL HIGH (ref <200)
HDL: 48 mg/dL — AB (ref >50)
LDL CHOLESTEROL (CALC): 138 mg/dL — AB
NON-HDL CHOLESTEROL (CALC): 170 mg/dL — AB (ref <130)
TRIGLYCERIDES: 186 mg/dL — AB (ref <150)

## 2017-09-29 LAB — COMPLETE METABOLIC PANEL WITH GFR
AG Ratio: 1.2 (calc) (ref 1.0–2.5)
ALBUMIN MSPROF: 4.2 g/dL (ref 3.6–5.1)
ALT: 39 U/L — ABNORMAL HIGH (ref 6–29)
AST: 47 U/L — AB (ref 10–35)
Alkaline phosphatase (APISO): 102 U/L (ref 33–130)
BUN: 12 mg/dL (ref 7–25)
CALCIUM: 9 mg/dL (ref 8.6–10.4)
CO2: 29 mmol/L (ref 20–32)
CREATININE: 0.78 mg/dL (ref 0.50–1.05)
Chloride: 97 mmol/L — ABNORMAL LOW (ref 98–110)
GFR, EST NON AFRICAN AMERICAN: 87 mL/min/{1.73_m2} (ref >=60)
GFR, Est African American: 101 mL/min/{1.73_m2} (ref >=60)
GLOBULIN: 3.5 g/dL (ref 1.9–3.7)
Glucose, Bld: 119 mg/dL — ABNORMAL HIGH (ref 65–99)
Potassium: 3.6 mmol/L (ref 3.5–5.3)
SODIUM: 140 mmol/L (ref 135–146)
Total Bilirubin: 0.9 mg/dL (ref 0.2–1.2)
Total Protein: 7.7 g/dL (ref 6.1–8.1)

## 2017-09-29 LAB — TSH: TSH: 5.14 m[IU]/L — AB

## 2017-09-30 ENCOUNTER — Encounter: Payer: Self-pay | Admitting: Physician Assistant

## 2017-09-30 DIAGNOSIS — E785 Hyperlipidemia, unspecified: Secondary | ICD-10-CM | POA: Insufficient documentation

## 2017-09-30 DIAGNOSIS — R748 Abnormal levels of other serum enzymes: Secondary | ICD-10-CM | POA: Insufficient documentation

## 2017-09-30 NOTE — Progress Notes (Signed)
Call pt: TSH still hypothyroid. Are you taking daily, first thing in am without anything else to eat or drink? Glucose elevated need to add A!C, please add this. Liver enzymes are elevated but not alarmingly high. Likely due to medications or fatty liver. Avoid tylenol and alcohol and lets recheck in 2 weeks.  WBC is elevated. Do you feel ok? Usually elevated due to infection/virus/prednisone use. Do any of these apply.  With elevated a1c, age, risk factors cholesterol is elevated. I would consider a cholesterol lowering drug but I know you have had reactions to statins. Lets see what a1c is and see how weight loss/exercise/diet changes are going and go from there.

## 2017-10-07 ENCOUNTER — Other Ambulatory Visit: Payer: Self-pay | Admitting: Physician Assistant

## 2017-10-10 NOTE — Telephone Encounter (Signed)
MUST MAKE APPOINTMENT> NO FURTHER REFILLS UNTIL THEN.

## 2017-10-16 NOTE — Progress Notes (Signed)
Did she check with insurance on options?   Do they pay for saxenda, belviq, contrave, qsymia?

## 2017-10-19 NOTE — Progress Notes (Signed)
Ok to wait to October 1st. Please encourage her to check insurance for the best weight loss drug for me to send.

## 2017-10-29 ENCOUNTER — Other Ambulatory Visit: Payer: Self-pay | Admitting: Physician Assistant

## 2017-11-13 ENCOUNTER — Other Ambulatory Visit: Payer: Self-pay | Admitting: Physician Assistant

## 2017-11-15 ENCOUNTER — Other Ambulatory Visit: Payer: Self-pay | Admitting: Physician Assistant

## 2017-11-16 ENCOUNTER — Other Ambulatory Visit: Payer: Self-pay

## 2017-11-16 MED ORDER — LOSARTAN POTASSIUM 100 MG PO TABS: 100.0000 mg | ORAL_TABLET | Freq: Every day | ORAL | 0 refills | Status: DC

## 2017-11-16 MED ORDER — METOPROLOL SUCCINATE ER 200 MG PO TB24: 200.0000 mg | ORAL_TABLET | Freq: Every day | ORAL | 0 refills | Status: DC

## 2017-11-16 MED ORDER — LEVOTHYROXINE SODIUM 137 MCG PO TABS: 137.0000 ug | ORAL_TABLET | Freq: Every day | ORAL | 0 refills | Status: DC

## 2017-11-16 MED ORDER — AMLODIPINE BESYLATE 10 MG PO TABS: 10.0000 mg | ORAL_TABLET | Freq: Every day | ORAL | 0 refills | Status: DC

## 2017-12-13 ENCOUNTER — Other Ambulatory Visit: Payer: Self-pay | Admitting: Physician Assistant

## 2018-01-07 ENCOUNTER — Ambulatory Visit: Payer: Self-pay | Admitting: Physician Assistant

## 2018-02-08 ENCOUNTER — Telehealth: Payer: Self-pay

## 2018-02-08 NOTE — Progress Notes (Signed)
Per provider request contacted patient with GI info for patient to schedule appointment.

## 2018-02-08 NOTE — Telephone Encounter (Signed)
Patient called back and confirmed she has already had a colonoscopy done in July of 2017. The report was normal and she was told to follow-up with her next colonoscopy in 5 years. I have faxed over report request to GI office. No further questions or concerns at this time.

## 2018-02-11 ENCOUNTER — Other Ambulatory Visit: Payer: Self-pay | Admitting: Physician Assistant

## 2018-02-23 ENCOUNTER — Other Ambulatory Visit: Payer: Self-pay | Admitting: Physician Assistant

## 2018-04-24 ENCOUNTER — Other Ambulatory Visit: Payer: Self-pay

## 2018-04-24 MED ORDER — METOPROLOL SUCCINATE ER 200 MG PO TB24: 200.0000 mg | ORAL_TABLET | Freq: Every day | ORAL | 0 refills | Status: DC

## 2018-04-24 MED ORDER — LOSARTAN POTASSIUM 100 MG PO TABS: 100.0000 mg | ORAL_TABLET | Freq: Every day | ORAL | 0 refills | Status: DC

## 2018-04-24 MED ORDER — AMLODIPINE BESYLATE 10 MG PO TABS: 10.0000 mg | ORAL_TABLET | Freq: Every day | ORAL | 0 refills | Status: DC

## 2018-04-24 MED ORDER — LEVOTHYROXINE SODIUM 137 MCG PO TABS: 137.0000 ug | ORAL_TABLET | Freq: Every day | ORAL | 0 refills | Status: DC

## 2018-04-24 MED ORDER — OMEPRAZOLE 20 MG PO CPDR: 20.0000 mg | DELAYED_RELEASE_CAPSULE | Freq: Every day | ORAL | 0 refills | Status: DC

## 2018-04-24 NOTE — Telephone Encounter (Signed)
Refill request

## 2018-05-28 ENCOUNTER — Telehealth: Payer: Self-pay | Admitting: Neurology

## 2018-05-28 NOTE — Telephone Encounter (Signed)
Spoke with patient and she states she is taking medication regularly. No issues with medication. No complaints about blood pressure. She is stuck in [place] since start of quarantine. She thanked me for my call. Appt in May.

## 2018-05-28 NOTE — Telephone Encounter (Signed)
Received alert from [facility] that patient has possibly been non-adherent with medication Metoprolol. Fill dates are as follows:  #30 on 05/05/2018 #30 on 04/09/2018 #30 on 02/23/2018 #30 on 01/05/2018 #30 on 11/21/2017  Left message on machine for patient to call back to discuss.

## 2018-06-26 ENCOUNTER — Encounter: Payer: Self-pay | Admitting: Physician Assistant

## 2018-06-26 ENCOUNTER — Ambulatory Visit (INDEPENDENT_AMBULATORY_CARE_PROVIDER_SITE_OTHER): Payer: BLUE CROSS/BLUE SHIELD | Admitting: Physician Assistant

## 2018-06-26 VITALS — Ht 66.0 in | Wt 376.0 lb

## 2018-06-26 DIAGNOSIS — I10 Essential (primary) hypertension: Secondary | ICD-10-CM | POA: Diagnosis not present

## 2018-06-26 DIAGNOSIS — R42 Dizziness and giddiness: Secondary | ICD-10-CM | POA: Diagnosis not present

## 2018-06-26 DIAGNOSIS — E039 Hypothyroidism, unspecified: Secondary | ICD-10-CM

## 2018-06-26 DIAGNOSIS — E8881 Metabolic syndrome: Secondary | ICD-10-CM

## 2018-06-26 MED ORDER — SEMAGLUTIDE(0.25 OR 0.5MG/DOS) 2 MG/1.5ML ~~LOC~~ SOPN: 0.5000 mg | PEN_INJECTOR | SUBCUTANEOUS | 2 refills | Status: AC

## 2018-06-26 MED ORDER — MECLIZINE HCL 25 MG PO TABS: 25.0000 mg | ORAL_TABLET | Freq: Three times a day (TID) | ORAL | 2 refills | Status: AC | PRN

## 2018-06-26 NOTE — Progress Notes (Signed)
Patient ID: Ann Stuart, female   DOB: [birth year], 53 y.o.   MRN: [id number] .Marland Kitchen.Virtual Visit via Telephone Note  I connected with Ann Stuart on 07/02/18 at  3:20 PM EDT by telephone and verified that I am speaking with the correct person using two identifiers.  Location: Patient: home Provider: clinic   I discussed the limitations, risks, security and privacy concerns of performing an evaluation and management service by telephone and the availability of in person appointments. I also discussed with the patient that there may be a patient responsible charge related to this service. The patient expressed understanding and agreed to proceed.   History of Present Illness: Pt is a 53 yo morbidly obese female with HTN, hypothyroidism, Meniere  disease who calls in to the clinic to discuss dizziness and weight loss. [facility]. For the last few months she has just felt "light headed" and at times "like the room was spinning". She suspects it is her meniere's disease. At times she has felt a little nauseated. She does not have anything to make this better. She has been drinking caffeine. She is not checking BP. She is taking her medication.   .. Active Ambulatory Problems    Diagnosis Date Noted  . Hypothyroidism 10/25/2016  . Situational stress 10/25/2016  . Morbidly obese (HCC) 10/25/2016  . Abnormal weight gain 10/25/2016  . Essential hypertension 10/25/2016  . Grade I hemorrhoids 10/30/2016  . Meniere's disease 03/20/2017  . Thoracic cyst 03/20/2017  . Menstrual abnormality 03/20/2017  . Rosacea 03/20/2017  . SOB (shortness of breath) 03/20/2017  . Atypical chest pain 03/20/2017  . Disorder of bone 08/27/2017  . History of ovarian cyst 08/27/2017  . Abdominal pain, chronic, bilateral lower quadrant 08/27/2017  . Seasonal allergic rhinitis 08/27/2017  . Dyslipidemia 09/30/2017  . Elevated liver enzymes 09/30/2017   Resolved Ambulatory Problems   Diagnosis Date Noted  . No Resolved Ambulatory Problems   Past Medical History:  Diagnosis Date  . Hypertension    Reviewed med, allergy, problem list.        Observations/Objective: No acute distress.  Normal mood.   .. Today's Vitals   06/26/18 1512  Weight: (!) 376 lb (170.6 kg)  Height: 5\' 6"  (1.676 m)   Body mass index is 60.69 kg/m.    Assessment and Plan: Marland Kitchen.Marland Kitchen.Diagnoses and all orders for this visit:  Dizziness -     meclizine (ANTIVERT) 25 MG tablet; Take 1 tablet (25 mg total) by mouth 3 (three) times daily as needed for dizziness.  Morbidly obese (HCC) -     Semaglutide,0.25 or 0.5MG /DOS, (OZEMPIC, 0.25 OR 0.5 MG/DOSE,) 2 MG/1.5ML SOPN; Inject 0.5 mg into the skin once a week.  Acquired hypothyroidism  Essential hypertension   I suspect dizziness is due to meineres or bPPV. Start epley manuevers TID for 3 reps. Sent antivert. Follow up as needed or if symptoms worsen or change.   Please check BP and report reading to office.   Marland Kitchen..Discussed low carb diet with 1500 calories and 80g of protein.  Exercising at least 150 minutes a week.  My Fitness Pal could be a [employer] Offic[employer]reat resource.  Start ozempic for metabolic syndrome. Discussed how to use and side effects.  Follow up in 2 months.   She does need labs. She will let us know where to send them. CBC/TSH/A1C/CMP.  Follow Up Instructions:    I discussed the assessment and treatment plan with the patient. The patient was provided an  opportunity to ask questions and all were answered. The patient agreed with the plan and demonstrated an understanding of the instructions.   The patient was advised to call back or seek an in-person evaluation if the symptoms worsen or if the condition fails to improve as anticipated.  I provided 24 minutes of non-face-to-face time during this encounter.   Tandy Gaw, PA-C

## 2018-06-26 NOTE — Progress Notes (Deleted)
Having lightheadedness. Wants to discuss whether this could be related to pinched nerve in her neck, increased pollen, possible diabetes (runs in family). Also discuss weight loss medicine.

## 2018-07-02 ENCOUNTER — Encounter: Payer: Self-pay | Admitting: Physician Assistant

## 2018-07-02 DIAGNOSIS — R42 Dizziness and giddiness: Secondary | ICD-10-CM | POA: Insufficient documentation

## 2018-07-02 DIAGNOSIS — E8881 Metabolic syndrome: Secondary | ICD-10-CM | POA: Insufficient documentation

## 2018-07-11 ENCOUNTER — Encounter: Payer: Self-pay | Admitting: Physician Assistant

## 2018-07-11 ENCOUNTER — Telehealth: Payer: Self-pay | Admitting: Neurology

## 2018-07-11 DIAGNOSIS — Z131 Encounter for screening for diabetes mellitus: Secondary | ICD-10-CM

## 2018-07-11 DIAGNOSIS — R635 Abnormal weight gain: Secondary | ICD-10-CM

## 2018-07-11 DIAGNOSIS — I10 Essential (primary) hypertension: Secondary | ICD-10-CM

## 2018-07-11 DIAGNOSIS — E8881 Metabolic syndrome: Secondary | ICD-10-CM

## 2018-07-11 DIAGNOSIS — N926 Irregular menstruation, unspecified: Secondary | ICD-10-CM

## 2018-07-11 DIAGNOSIS — R748 Abnormal levels of other serum enzymes: Secondary | ICD-10-CM

## 2018-07-11 DIAGNOSIS — E039 Hypothyroidism, unspecified: Secondary | ICD-10-CM

## 2018-07-11 NOTE — Telephone Encounter (Signed)
Patient called confused that her pharmacy let her know her diabetes medication was ready, but she does not have diabetes. I explained that Ozempic can be used for DM and weight loss. She does not want to use an injection for weight loss and wants to discuss alternative options. Appt made for discussion.

## 2018-07-12 NOTE — Telephone Encounter (Signed)
Labs ordered and faxed to [facility] at [phone] and request they fax results back to [phone]  Advised pt this was completed.

## 2018-07-12 NOTE — Telephone Encounter (Signed)
Ok to send

## 2018-07-22 ENCOUNTER — Encounter: Payer: Self-pay | Admitting: Physician Assistant

## 2018-07-22 ENCOUNTER — Ambulatory Visit (INDEPENDENT_AMBULATORY_CARE_PROVIDER_SITE_OTHER): Payer: BC Managed Care – PPO | Admitting: Physician Assistant

## 2018-07-22 DIAGNOSIS — G8929 Other chronic pain: Secondary | ICD-10-CM | POA: Diagnosis not present

## 2018-07-22 DIAGNOSIS — M25561 Pain in right knee: Secondary | ICD-10-CM

## 2018-07-22 MED ORDER — PHENTERMINE HCL 15 MG PO CAPS: 15.0000 mg | ORAL_CAPSULE | ORAL | 0 refills | Status: AC

## 2018-07-22 MED ORDER — TOPIRAMATE 50 MG PO TABS: ORAL_TABLET | ORAL | 2 refills | Status: AC

## 2018-07-22 NOTE — Progress Notes (Deleted)
Patient wants to discuss weight loss medications that are not injectables. She doesn't feel comfortable with injections. She also wanted you to know she had Xray on her right knee and has no cartilage. They state weight gain has contributed to this and are recommending physical therapy.

## 2018-07-22 NOTE — Progress Notes (Signed)
Patient ID: Ann Stuart, female   DOB: 29-Apr-1965, 53 y.o.   MRN: [id number] .Marland Kitchen.Virtual Visit via Telephone Note  I connected with Ann OharaBarbara Tolles on 07/29/18 at  1:00 PM EDT by telephone and verified that I am speaking with the correct person using two identifiers.  Location: Patient: home Provider: clinic   I discussed the limitations, risks, security and privacy concerns of performing an evaluation and management service by telephone and the availability of in person appointments. I also discussed with the patient that there may be a patient responsible charge related to this service. The patient expressed understanding and agreed to proceed.   History of Present Illness: Pt is a 53 yo morbidly obese female who calls in to the clinic to follow up on weight loss. She is recently more motivated to lose weight due to worsening of right knee pain. She is currently in [place] and went to ortho group out that way and per patient stated needed to lose weight. No surgical intervention needed at this point. Her knee pain is causing her not to be able to ambulate like she should. She is very frustrated. Sent saxenda but not covered through insurance. Sent ozempic and having trouble with that as well.   She has not had labs drawn due to lab not receiving fax.   .. Active Ambulatory Problems    Diagnosis Date Noted  . Hypothyroidism 10/25/2016  . Situational stress 10/25/2016  . Morbidly obese (HCC) 10/25/2016  . Abnormal weight gain 10/25/2016  . Essential hypertension 10/25/2016  . Grade I hemorrhoids 10/30/2016  . Meniere's disease 03/20/2017  . Thoracic cyst 03/20/2017  . Menstrual abnormality 03/20/2017  . Rosacea 03/20/2017  . SOB (shortness of breath) 03/20/2017  . Atypical chest pain 03/20/2017  . Disorder of bone 08/27/2017  . History of ovarian cyst 08/27/2017  . Abdominal pain, chronic, bilateral lower quadrant 08/27/2017  . Seasonal allergic rhinitis 08/27/2017  .  Dyslipidemia 09/30/2017  . Elevated liver enzymes 09/30/2017  . Dizziness 07/02/2018  . Metabolic syndrome 07/02/2018  . Right knee pain 07/29/2018   Resolved Ambulatory Problems    Diagnosis Date Noted  . No Resolved Ambulatory Problems   Past Medical History:  Diagnosis Date  . Hypertension    Reviewed med, allergy, problem list.   Observations/Objective: No acute distress.  .. Today's Vitals   07/22/18 1122  BP: 130/74  SpO2: 97%  Weight: (!) 376 lb (170.6 kg)  Height: 5\' 6"  (1.676 m)   Body mass index is 60.69 kg/m.   Assessment and Plan: Marland Kitchen.Marland Kitchen.Britta MccreedyBarbara was seen today for obesity.  Diagnoses and all orders for this visit:  Morbid obesity (HCC) -     phentermine 15 MG capsule; Take 1 capsule (15 mg total) by mouth every morning. -     topiramate (TOPAMAX) 50 MG tablet; One half tab by mouth daily for a week, then one tab by mouth daily then increase to twice a day.  Chronic pain of right knee   Continue with ortho group for right knee pain management.   Marland Kitchen..Discussed low carb diet with 1500 calories and 80g of protein.  Exercising at least 150 minutes a week.  My Fitness Pal could be a [employer] resource.  For now try phentermine and topamax.  Discussed side effects.  Follow up in 1 month.  Discussed surgical intervention due to amount of weight needing to lose. I would like to get her set up with consult. Referral made.   Follow Up Instructions:  I discussed the assessment and treatment plan with the patient. The patient was provided an opportunity to ask questions and all were answered. The patient agreed with the plan and demonstrated an understanding of the instructions.   The patient was advised to call back or seek an in-person evaluation if the symptoms worsen or if the condition fails to improve as anticipated.  I provided 15 minutes of non-face-to-face time during this encounter.   Iran Planas, PA-C

## 2018-07-29 ENCOUNTER — Telehealth: Payer: Self-pay | Admitting: Physician Assistant

## 2018-07-29 DIAGNOSIS — M25561 Pain in right knee: Secondary | ICD-10-CM | POA: Insufficient documentation

## 2018-07-29 NOTE — Telephone Encounter (Signed)
Pt says the labs we faxed to [facility] in [place] were never received can we look into this?

## 2018-07-30 NOTE — Telephone Encounter (Signed)
Re-faxed labs.   Called and left pt msg that these were re-faxed and provided her with the fax number they have been sent to. Advised pt to call with any further questions or concerns

## 2018-07-31 ENCOUNTER — Other Ambulatory Visit: Payer: Self-pay | Admitting: Physician Assistant

## 2018-08-06 IMAGING — DX DG CHEST 2V
2 series · 2 of 2 positions shown · non-contrast
Comparison: None in PACs

CLINICAL DATA: Shortness of breath for the past month. Nonsmoker.
History of previous episodes of pneumonia.

EXAM:
CHEST  2 VIEW

[chest pa]
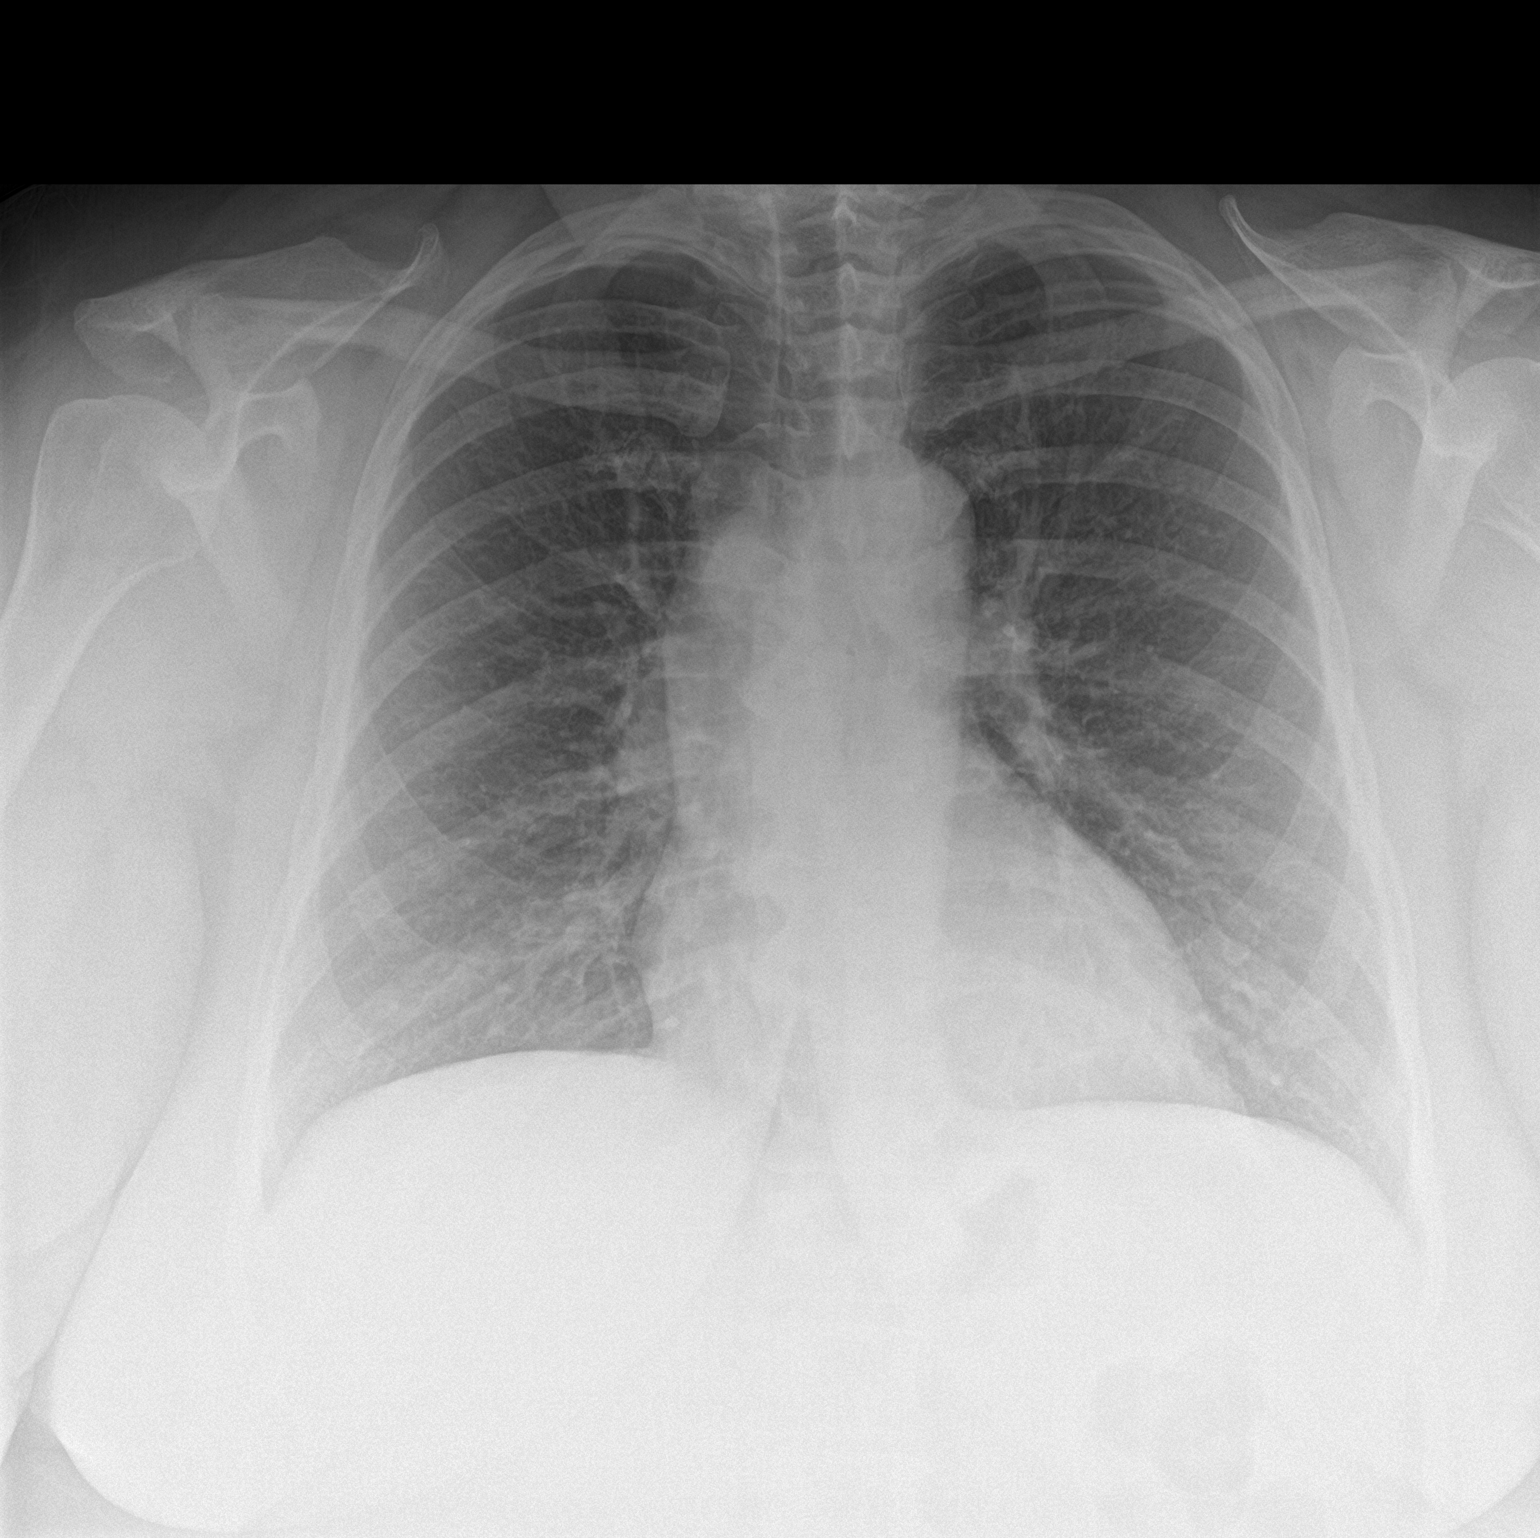

[chest lat]
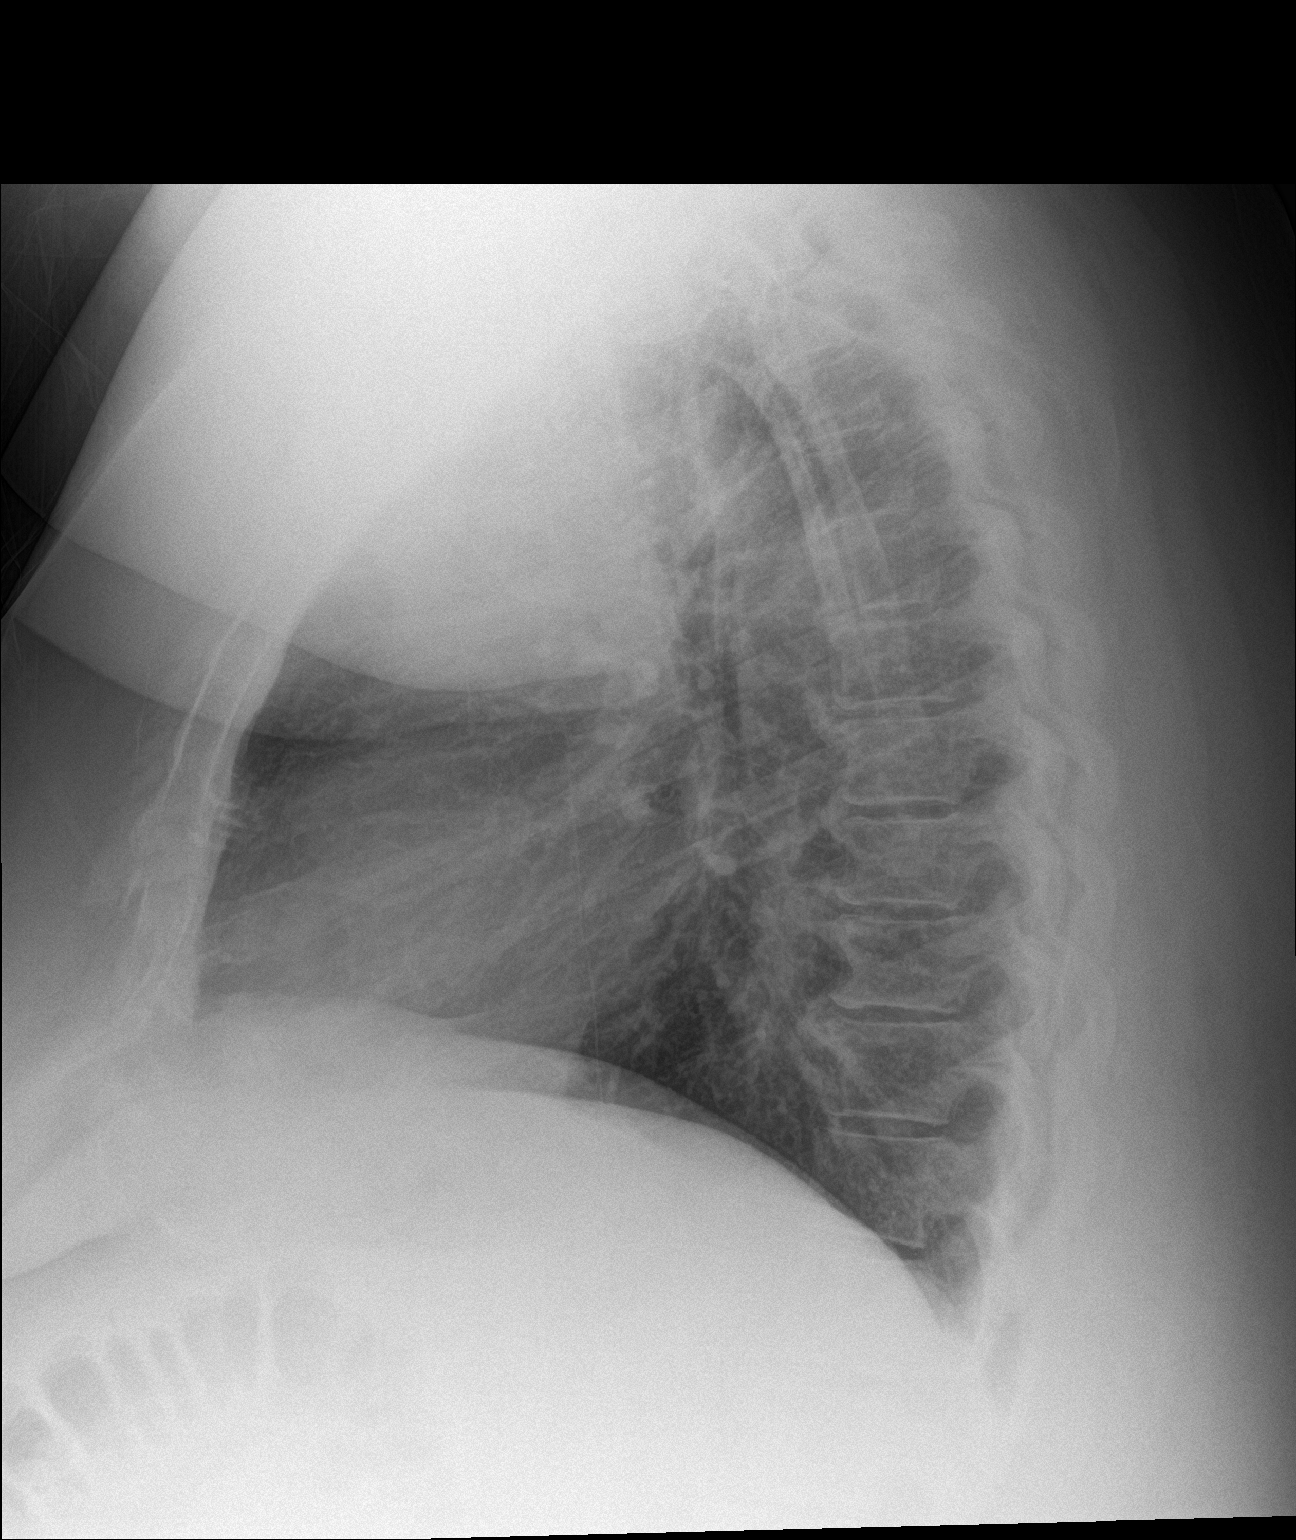

[2 of 2 positions shown; findings below may reference images not displayed]

FINDINGS: The lungs are adequately inflated. There is no focal infiltrate.
There is no pleural effusion. The heart and pulmonary vascularity
are normal. The mediastinum is normal in width. The trachea is
midline. There is mild degenerative disc disease of the thoracic
spine.
IMPRESSION: There is no pneumonia, CHF, nor other acute cardiopulmonary
abnormality.

## 2018-08-21 ENCOUNTER — Other Ambulatory Visit: Payer: Self-pay | Admitting: Physician Assistant

## 2018-08-28 ENCOUNTER — Other Ambulatory Visit: Payer: Self-pay | Admitting: Physician Assistant

## 2018-08-28 MED ORDER — OMEPRAZOLE 20 MG PO CPDR: 20.0000 mg | DELAYED_RELEASE_CAPSULE | Freq: Every day | ORAL | 0 refills | Status: DC

## 2018-08-28 MED ORDER — LOSARTAN POTASSIUM 100 MG PO TABS: 100.0000 mg | ORAL_TABLET | Freq: Every day | ORAL | 0 refills | Status: DC

## 2018-08-28 MED ORDER — AMLODIPINE BESYLATE 10 MG PO TABS: 10.0000 mg | ORAL_TABLET | Freq: Every day | ORAL | 0 refills | Status: DC

## 2018-08-28 MED ORDER — METOPROLOL SUCCINATE ER 200 MG PO TB24: 200.0000 mg | ORAL_TABLET | Freq: Every day | ORAL | 0 refills | Status: DC

## 2018-08-28 MED ORDER — LEVOTHYROXINE SODIUM 137 MCG PO TABS: 137.0000 ug | ORAL_TABLET | Freq: Every day | ORAL | 0 refills | Status: DC

## 2018-09-20 ENCOUNTER — Other Ambulatory Visit: Payer: Self-pay | Admitting: Physician Assistant

## 2018-10-04 ENCOUNTER — Other Ambulatory Visit: Payer: Self-pay | Admitting: Physician Assistant

## 2018-10-04 ENCOUNTER — Encounter: Payer: Self-pay | Admitting: Physician Assistant

## 2018-10-04 MED ORDER — LOSARTAN POTASSIUM 100 MG PO TABS: 100.0000 mg | ORAL_TABLET | Freq: Every day | ORAL | 0 refills | Status: DC

## 2018-10-04 MED ORDER — AMLODIPINE BESYLATE 10 MG PO TABS: 10.0000 mg | ORAL_TABLET | Freq: Every day | ORAL | 0 refills | Status: DC

## 2018-10-04 NOTE — Telephone Encounter (Signed)
Medications sent. Phone number not working to call lab to get fax number. Will look into this further.

## 2018-10-04 NOTE — Telephone Encounter (Signed)
Will you please refax labs. Ok to refill medications 90 day refill.

## 2018-10-04 NOTE — Telephone Encounter (Signed)
Lab order faxed to [phone] with confirmation received.

## 2018-10-23 ENCOUNTER — Encounter: Payer: Self-pay | Admitting: Physician Assistant

## 2018-10-23 MED ORDER — OMEPRAZOLE 20 MG PO CPDR: 20.0000 mg | DELAYED_RELEASE_CAPSULE | Freq: Every day | ORAL | 1 refills | Status: AC

## 2018-10-24 ENCOUNTER — Other Ambulatory Visit: Payer: Self-pay | Admitting: Physician Assistant

## 2018-11-08 ENCOUNTER — Encounter: Payer: Self-pay | Admitting: Physician Assistant

## 2018-11-10 MED ORDER — AMLODIPINE BESYLATE 10 MG PO TABS: 10.0000 mg | ORAL_TABLET | Freq: Every day | ORAL | 0 refills | Status: DC

## 2018-11-10 MED ORDER — LOSARTAN POTASSIUM 100 MG PO TABS: 100.0000 mg | ORAL_TABLET | Freq: Every day | ORAL | 0 refills | Status: AC

## 2018-12-13 ENCOUNTER — Encounter: Payer: Self-pay | Admitting: Physician Assistant

## 2018-12-13 MED ORDER — LEVOTHYROXINE SODIUM 137 MCG PO TABS: 137.0000 ug | ORAL_TABLET | Freq: Every day | ORAL | 0 refills | Status: AC

## 2018-12-13 NOTE — Telephone Encounter (Signed)
FYI update

## 2019-08-06 ENCOUNTER — Other Ambulatory Visit: Payer: Self-pay | Admitting: Physician Assistant

## 2019-12-29 ENCOUNTER — Other Ambulatory Visit: Payer: Self-pay | Admitting: Physician Assistant
